# Patient Record
Sex: Female | Born: 1990 | Race: White | Hispanic: No | Marital: Married | Smoking: Never smoker
Health system: Southern US, Community
[De-identification: ages and names within clinical notes are randomized; demographics above are authoritative.]

## PROBLEM LIST (undated history)

## (undated) DIAGNOSIS — J302 Other seasonal allergic rhinitis: Secondary | ICD-10-CM

## (undated) DIAGNOSIS — J45909 Unspecified asthma, uncomplicated: Secondary | ICD-10-CM

---
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
## 2012-05-24 ENCOUNTER — Encounter (HOSPITAL_BASED_OUTPATIENT_CLINIC_OR_DEPARTMENT_OTHER): Payer: Self-pay | Admitting: *Deleted

## 2012-05-24 ENCOUNTER — Emergency Department (HOSPITAL_BASED_OUTPATIENT_CLINIC_OR_DEPARTMENT_OTHER)
Admission: EM | Admit: 2012-05-24 | Discharge: 2012-05-24 | Disposition: A | Discharge status: Home Health | Payer: Self-pay | Attending: Emergency Medicine | Admitting: Emergency Medicine

## 2012-05-24 DIAGNOSIS — N390 Urinary tract infection, site not specified: Secondary | ICD-10-CM

## 2012-05-24 DIAGNOSIS — J45909 Unspecified asthma, uncomplicated: Secondary | ICD-10-CM | POA: Insufficient documentation

## 2012-05-24 HISTORY — DX: Other seasonal allergic rhinitis: J30.2

## 2012-05-24 HISTORY — DX: Unspecified asthma, uncomplicated: J45.909

## 2012-05-24 LAB — URINALYSIS, ROUTINE W REFLEX MICROSCOPIC
Ketones, ur: 15 mg/dL — AB
Nitrite: POSITIVE — AB
Protein, ur: 30 mg/dL — AB
Specific Gravity, Urine: 1.034 — ABNORMAL HIGH (ref 1.005–1.030)
Urobilinogen, UA: 4 mg/dL — ABNORMAL HIGH (ref 0.0–1.0)
pH: 5 (ref 5.0–8.0)

## 2012-05-24 LAB — URINE MICROSCOPIC-ADD ON

## 2012-05-24 LAB — PREGNANCY, URINE: Preg Test, Ur: NEGATIVE

## 2012-05-24 MED ORDER — IBUPROFEN 800 MG PO TABS: 800.0000 mg | ORAL_TABLET | Freq: Three times a day (TID) | ORAL | Status: AC | PRN

## 2012-05-24 MED ORDER — CIPROFLOXACIN HCL 500 MG PO TABS: 500.0000 mg | ORAL_TABLET | Freq: Two times a day (BID) | ORAL | Status: AC

## 2012-05-24 NOTE — ED Provider Notes (Signed)
History     CSN: [id number]  Arrival date & time 05/24/12  2023   First MD Initiated Contact with Patient 05/24/12 2131     10:22 PM HPI Patient reports for 4 days has had urinary tract infection. States 2 days ago she was diagnosed and prescribed Bactrim. Reports symptoms have not improved despite 2 days using antibiotics and urinary relief medication. Denies abdominal pain, vaginal discharge, fever, nausea, vomiting, back pain. Reports severe dysuria, increased urinary frequency and urgency. Patient is a 21 y.o. female presenting with urinary tract infection. The history is provided by the patient.  Urinary Tract Infection This is a new problem. Episode onset: 4 days. The problem occurs constantly. The problem has been unchanged. Associated symptoms include urinary symptoms. Pertinent negatives include no abdominal pain, chest pain, chills, fever, nausea, numbness, vomiting or weakness. Exacerbated by: urinating. Treatments tried: bactrim and AZO urinary relief. The treatment provided no relief.    Past Medical History  Diagnosis Date  . Asthma   . Seasonal allergies     History reviewed. No pertinent past surgical history.  History reviewed. No pertinent family history.  History  Substance Use Topics  . Smoking status: Never Smoker   . Smokeless tobacco: Not on file  . Alcohol Use: No    OB History    Grav Para Term Preterm Abortions TAB SAB Ect Mult Living                  Review of Systems  Constitutional: Negative for fever and chills.  Respiratory: Negative for shortness of breath.   Cardiovascular: Negative for chest pain.  Gastrointestinal: Negative for nausea, vomiting, abdominal pain, diarrhea and constipation.  Genitourinary: Positive for dysuria, urgency and frequency. Negative for hematuria, flank pain, vaginal discharge and vaginal pain.  Musculoskeletal: Negative for back pain.  Neurological: Negative for weakness and numbness.  All other systems reviewed  and are negative.    Allergies  Cashew nut oil; Amoxicillin; and Penicillins  Home Medications   Current Outpatient Rx  Name Route Sig Dispense Refill  . ALBUTEROL SULFATE HFA 108 (90 BASE) MCG/ACT IN AERS Inhalation Inhale 2 puffs into the lungs every 6 (six) hours as needed. For sports induced asthma    . IBUPROFEN 200 MG PO TABS Oral Take 400 mg by mouth every 6 (six) hours as needed. For pain    . PHENAZOPYRIDINE HCL 95 MG PO TABS Oral Take 95 mg by mouth 3 (three) times daily as needed. For painful urination    . SULFAMETHOXAZOLE-TMP DS 800-160 MG PO TABS Oral Take 1 tablet by mouth 2 (two) times daily. For 5 days starting 05/20/2012      BP 107/75  Pulse 84  Temp 97.7 F (36.5 C) (Oral)  Resp 20  Ht 5\' 4"  (1.626 m)  Wt 153 lb (69.4 kg)  BMI 26.26 kg/m2  SpO2 99%  LMP 05/04/2012  Physical Exam  Vitals reviewed. Constitutional: She is oriented to person, place, and time. Vital signs are normal. She appears well-developed and well-nourished.  HENT:  Head: Normocephalic and atraumatic.  Eyes: Conjunctivae are normal. Pupils are equal, round, and reactive to light.  Neck: Normal range of motion. Neck supple.  Cardiovascular: Normal rate, regular rhythm and normal heart sounds.  Exam reveals no friction rub.   No murmur heard. Pulmonary/Chest: Effort normal and breath sounds normal. She has no wheezes. She has no rhonchi. She has no rales. She exhibits no tenderness.  Abdominal: Normal appearance and bowel sounds  are normal. She exhibits no distension and no mass. There is tenderness (mild) in the suprapubic area. There is no rigidity, no rebound, no guarding, no CVA tenderness, no tenderness at McBurney's point and negative Murphy's sign.  Musculoskeletal: Normal range of motion.  Neurological: She is alert and oriented to person, place, and time.  Skin: Skin is warm and dry. No rash noted. No erythema. No pallor.    ED Course  Procedures  Results for orders placed  during the hospital encounter of 05/24/12  URINALYSIS, ROUTINE W REFLEX MICROSCOPIC      Component Value Range   Color, Urine RED (*) YELLOW   APPearance CLOUDY (*) CLEAR   Specific Gravity, Urine 1.034 (*) 1.005 - 1.030   pH 5.0  5.0 - 8.0   Glucose, UA NEGATIVE  NEGATIVE mg/dL   Hgb urine dipstick NEGATIVE  NEGATIVE   Bilirubin Urine MODERATE (*) NEGATIVE   Ketones, ur 15 (*) NEGATIVE mg/dL   Protein, ur 30 (*) NEGATIVE mg/dL   Urobilinogen, UA 4.0 (*) 0.0 - 1.0 mg/dL   Nitrite POSITIVE (*) NEGATIVE   Leukocytes, UA MODERATE (*) NEGATIVE  PREGNANCY, URINE      Component Value Range   Preg Test, Ur NEGATIVE  NEGATIVE  URINE MICROSCOPIC-ADD ON      Component Value Range   Squamous Epithelial / LPF FEW (*) RARE   WBC, UA 3-6  <3 WBC/hpf   RBC / HPF 3-6  <3 RBC/hpf   Bacteria, UA FEW (*) RARE     MDM   Treat patient with ciprofloxacin for UTI. Have also ordered a urine culture. Patient voices understanding and is ready for discharge  Thomasene Lot, Cordelia Poche 05/24/12 2227

## 2012-05-24 NOTE — Discharge Instructions (Signed)

## 2012-05-24 NOTE — ED Notes (Signed)
Pt states she was having urinary s/s and saw the Dr. Around the middle of the week. Dx'd with UTI. Given Bactrim. Taking Azo as well, but now having increased pain and itching.

## 2012-05-26 LAB — URINE CULTURE
Colony Count: NO GROWTH
Culture: NO GROWTH

## 2012-05-26 NOTE — ED Provider Notes (Signed)
Medical screening examination/treatment/procedure(s) were performed by non-physician practitioner and as supervising physician I was immediately available for consultation/collaboration.    Cherine Drumgoole L Jonothan Heberle, MD 05/26/12 0708 

## 2012-08-25 ENCOUNTER — Encounter (HOSPITAL_BASED_OUTPATIENT_CLINIC_OR_DEPARTMENT_OTHER): Payer: Self-pay | Admitting: *Deleted

## 2012-08-25 ENCOUNTER — Emergency Department (HOSPITAL_BASED_OUTPATIENT_CLINIC_OR_DEPARTMENT_OTHER)
Admission: EM | Admit: 2012-08-25 | Discharge: 2012-08-25 | Disposition: A | Discharge status: Home / Self Care | Payer: Self-pay | Attending: Emergency Medicine | Admitting: Emergency Medicine

## 2012-08-25 DIAGNOSIS — N390 Urinary tract infection, site not specified: Secondary | ICD-10-CM | POA: Insufficient documentation

## 2012-08-25 DIAGNOSIS — Z880 Allergy status to penicillin: Secondary | ICD-10-CM | POA: Insufficient documentation

## 2012-08-25 DIAGNOSIS — Z91018 Allergy to other foods: Secondary | ICD-10-CM | POA: Insufficient documentation

## 2012-08-25 LAB — URINALYSIS, ROUTINE W REFLEX MICROSCOPIC
Ketones, ur: 15 mg/dL — AB
Nitrite: POSITIVE — AB
Protein, ur: NEGATIVE mg/dL
Urobilinogen, UA: 4 mg/dL — ABNORMAL HIGH (ref 0.0–1.0)
pH: 6 (ref 5.0–8.0)

## 2012-08-25 LAB — URINE MICROSCOPIC-ADD ON

## 2012-08-25 MED ORDER — CIPROFLOXACIN HCL 500 MG PO TABS: 500.0000 mg | ORAL_TABLET | Freq: Two times a day (BID) | ORAL | Status: DC

## 2012-08-25 MED ORDER — PHENAZOPYRIDINE HCL 200 MG PO TABS: 200.0000 mg | ORAL_TABLET | Freq: Three times a day (TID) | ORAL | Status: DC

## 2012-08-25 NOTE — ED Notes (Signed)
Possible UTI. Vaginal stinging. Some relief with AZO. Hx of UTI.

## 2012-08-25 NOTE — ED Provider Notes (Signed)
History     CSN: [id number]  Arrival date & time 08/25/12  1322   First MD Initiated Contact with Patient 08/25/12 1401      Chief Complaint  Patient presents with  . Urinary Tract Infection    (Consider location/radiation/quality/duration/timing/severity/associated sxs/prior treatment) HPI Comments: Patient with two day history of burning with urination, frequency.  No fever, chills, or vomiting.  Had uti in June of this past year and treated with cipro successfully.  The LMP was two weeks ago and normal.  She is sexually active with one partner using protection but denies discharge or bleeding.  Patient is a 21 y.o. female presenting with urinary tract infection. The history is provided by the patient.  Urinary Tract Infection This is a recurrent problem. The current episode started 2 days ago. The problem occurs constantly. The problem has been gradually worsening. Pertinent negatives include no chest pain and no abdominal pain. Exacerbated by: urinating. Nothing relieves the symptoms. She has tried nothing for the symptoms. The treatment provided mild relief.    Past Medical History  Diagnosis Date  . Asthma   . Seasonal allergies     History reviewed. No pertinent past surgical history.  No family history on file.  History  Substance Use Topics  . Smoking status: Never Smoker   . Smokeless tobacco: Not on file  . Alcohol Use: No    OB History    Grav Para Term Preterm Abortions TAB SAB Ect Mult Living                  Review of Systems  Cardiovascular: Negative for chest pain.  Gastrointestinal: Negative for abdominal pain.  All other systems reviewed and are negative.    Allergies  Cashew nut oil; Amoxicillin; and Penicillins  Home Medications   Current Outpatient Rx  Name Route Sig Dispense Refill  . ALBUTEROL SULFATE HFA 108 (90 BASE) MCG/ACT IN AERS Inhalation Inhale 2 puffs into the lungs every 6 (six) hours as needed. For sports induced asthma      . IBUPROFEN 200 MG PO TABS Oral Take 400 mg by mouth every 6 (six) hours as needed. For pain    . PHENAZOPYRIDINE HCL 95 MG PO TABS Oral Take 95 mg by mouth 3 (three) times daily as needed. For painful urination    . SULFAMETHOXAZOLE-TMP DS 800-160 MG PO TABS Oral Take 1 tablet by mouth 2 (two) times daily. For 5 days starting 05/20/2012      BP 117/71  Pulse 82  Temp 97.8 F (36.6 C) (Oral)  Resp 18  SpO2 100%  LMP 08/13/2012  Physical Exam  Nursing note and vitals reviewed. Constitutional: She is oriented to person, place, and time. She appears well-developed and well-nourished. No distress.  HENT:  Head: Normocephalic and atraumatic.  Mouth/Throat: Oropharynx is clear and moist.  Neck: Normal range of motion. Neck supple.  Abdominal: Soft. Bowel sounds are normal. She exhibits no distension. There is no tenderness.       There is no cva tenderness.  Musculoskeletal: Normal range of motion.  Neurological: She is alert and oriented to person, place, and time.  Skin: Skin is warm and dry. She is not diaphoretic.    ED Course  Procedures (including critical care time)   Labs Reviewed  PREGNANCY, URINE  URINALYSIS, ROUTINE W REFLEX MICROSCOPIC   No results found.   No diagnosis found.    MDM  The patient presents with urinary frequency, urgency.  The ua  reveals a uti.  Will treat with cipro, pyridium.  To return prn for any problems.        Geoffery Lyons, MD 08/25/12 1416

## 2012-08-25 NOTE — ED Notes (Signed)
Pt. Reports no discharge and has history of UTI burning with urination.

## 2016-10-23 ENCOUNTER — Emergency Department (HOSPITAL_BASED_OUTPATIENT_CLINIC_OR_DEPARTMENT_OTHER): Payer: Self-pay

## 2016-10-23 ENCOUNTER — Encounter (HOSPITAL_BASED_OUTPATIENT_CLINIC_OR_DEPARTMENT_OTHER): Payer: Self-pay | Admitting: *Deleted

## 2016-10-23 ENCOUNTER — Emergency Department (HOSPITAL_BASED_OUTPATIENT_CLINIC_OR_DEPARTMENT_OTHER)
Admission: EM | Admit: 2016-10-23 | Discharge: 2016-10-23 | Disposition: A | Discharge status: Home / Self Care | Payer: Self-pay | Attending: Physician Assistant | Admitting: Physician Assistant

## 2016-10-23 DIAGNOSIS — R109 Unspecified abdominal pain: Secondary | ICD-10-CM

## 2016-10-23 DIAGNOSIS — J45909 Unspecified asthma, uncomplicated: Secondary | ICD-10-CM | POA: Insufficient documentation

## 2016-10-23 DIAGNOSIS — R112 Nausea with vomiting, unspecified: Secondary | ICD-10-CM | POA: Insufficient documentation

## 2016-10-23 DIAGNOSIS — R1033 Periumbilical pain: Secondary | ICD-10-CM | POA: Insufficient documentation

## 2016-10-23 LAB — CBC WITH DIFFERENTIAL/PLATELET
Basophils Absolute: 0 10*3/uL (ref 0.0–0.1)
Basophils Relative: 0 %
Eosinophils Absolute: 0.6 10*3/uL (ref 0.0–0.7)
Eosinophils Relative: 8 %
HCT: 43 % (ref 36.0–46.0)
HEMOGLOBIN: 14.6 g/dL (ref 12.0–15.0)
LYMPHS ABS: 1.8 10*3/uL (ref 0.7–4.0)
LYMPHS PCT: 23 %
MCH: 29.1 pg (ref 26.0–34.0)
MCHC: 34 g/dL (ref 30.0–36.0)
MCV: 85.7 fL (ref 78.0–100.0)
MONOS PCT: 6 %
Monocytes Absolute: 0.5 10*3/uL (ref 0.1–1.0)
NEUTROS PCT: 63 %
Neutro Abs: 4.9 10*3/uL (ref 1.7–7.7)
Platelets: 321 10*3/uL (ref 150–400)
RBC: 5.02 MIL/uL (ref 3.87–5.11)
RDW: 13 % (ref 11.5–15.5)
WBC: 7.7 10*3/uL (ref 4.0–10.5)

## 2016-10-23 LAB — URINALYSIS, ROUTINE W REFLEX MICROSCOPIC
Bilirubin Urine: NEGATIVE
Glucose, UA: NEGATIVE mg/dL
HGB URINE DIPSTICK: NEGATIVE
Ketones, ur: NEGATIVE mg/dL
Leukocytes, UA: NEGATIVE
NITRITE: NEGATIVE
PH: 5.5 (ref 5.0–8.0)
Protein, ur: NEGATIVE mg/dL
SPECIFIC GRAVITY, URINE: 1.017 (ref 1.005–1.030)

## 2016-10-23 LAB — COMPREHENSIVE METABOLIC PANEL
ALK PHOS: 47 U/L (ref 38–126)
ALT: 22 U/L (ref 14–54)
ANION GAP: 7 (ref 5–15)
AST: 27 U/L (ref 15–41)
Albumin: 4 g/dL (ref 3.5–5.0)
BILIRUBIN TOTAL: 0.4 mg/dL (ref 0.3–1.2)
BUN: 11 mg/dL (ref 6–20)
CALCIUM: 9.4 mg/dL (ref 8.9–10.3)
CO2: 27 mmol/L (ref 22–32)
CREATININE: 0.91 mg/dL (ref 0.44–1.00)
Chloride: 100 mmol/L — ABNORMAL LOW (ref 101–111)
Glucose, Bld: 93 mg/dL (ref 65–99)
Potassium: 3.6 mmol/L (ref 3.5–5.1)
Sodium: 134 mmol/L — ABNORMAL LOW (ref 135–145)
TOTAL PROTEIN: 7.6 g/dL (ref 6.5–8.1)

## 2016-10-23 LAB — LIPASE, BLOOD: LIPASE: 26 U/L (ref 11–51)

## 2016-10-23 LAB — PREGNANCY, URINE: Preg Test, Ur: NEGATIVE

## 2016-10-23 MED ORDER — OXYCODONE-ACETAMINOPHEN 5-325 MG PO TABS: 1.0000 | ORAL_TABLET | ORAL | 0 refills | Status: AC | PRN

## 2016-10-23 MED ORDER — ONDANSETRON 4 MG PO TBDP: 4.0000 mg | ORAL_TABLET | Freq: Three times a day (TID) | ORAL | 0 refills | Status: AC | PRN

## 2016-10-23 MED ORDER — MORPHINE SULFATE (PF) 4 MG/ML IV SOLN
4.0000 mg | Freq: Once | INTRAVENOUS | Status: AC
  Administered 2016-10-23: 4 mg via INTRAVENOUS
  Filled 2016-10-23: qty 1

## 2016-10-23 MED ORDER — ONDANSETRON HCL 4 MG/2ML IJ SOLN
4.0000 mg | Freq: Once | INTRAMUSCULAR | Status: AC
  Administered 2016-10-23: 4 mg via INTRAVENOUS
  Filled 2016-10-23: qty 2

## 2016-10-23 MED ORDER — IOPAMIDOL (ISOVUE-300) INJECTION 61%
100.0000 mL | Freq: Once | INTRAVENOUS | Status: AC | PRN
  Administered 2016-10-23: 100 mL via INTRAVENOUS

## 2016-10-23 NOTE — ED Triage Notes (Signed)
Pt c/o diffuse abd pain x 3 days , sent here from PMD office for eval

## 2016-10-23 NOTE — ED Provider Notes (Signed)
[facility]   CSN: [id number] Arrival date & time: 10/23/16  1627     History   Chief Complaint Chief Complaint  Patient presents with  . Abdominal Pain    HPI Danielle Beasley is a 25 y.o. female.  The history is provided by the patient and medical records.  Abdominal Pain   Associated symptoms include nausea and vomiting.     25 y.o. F with hx of asthma and seasonal allergies, presenting to the ED for abdominal pain.  States this began Sunday Night and has remained persistent since. States pain is localized around her umbilicus, but sometimes "fans out" across her lower abdomen. She reports associated nausea and vomiting which is usually worse in the morning. She also reports a few episodes of loose stools but no frank diarrhea. No melena or hematochezia. No fever or chills. States she did not eat at a [employer], however her husband ate the same food as her and is not having any symptoms. No history of GI issues in the past. No prior abdominal surgeries. States she was seen by her primary care doctor and sent here for further evaluation.  Past Medical History:  Diagnosis Date  . Asthma   . Seasonal allergies     There are no active problems to display for this patient.   History reviewed. No pertinent surgical history.  OB History    No data available       Home Medications    Prior to Admission medications   Medication Sig Start Date End Date Taking? Authorizing Provider  albuterol (PROVENTIL HFA;VENTOLIN HFA) 108 (90 BASE) MCG/ACT inhaler Inhale 2 puffs into the lungs every 6 (six) hours as needed. For sports induced asthma    Historical Provider, MD  ibuprofen (ADVIL,MOTRIN) 200 MG tablet Take 400 mg by mouth every 6 (six) hours as needed. For pain    Historical Provider, MD    Family History No family history on file.  Social History Social History  Substance Use Topics  . Smoking status: Never Smoker  . Smokeless  tobacco: Not on file  . Alcohol use No     Allergies   Cashew nut oil; Amoxicillin; and Penicillins   Review of Systems Review of Systems  Gastrointestinal: Positive for abdominal pain, nausea and vomiting.  All other systems reviewed and are negative.    Physical Exam Updated Vital Signs Ht 5\' 4"  (1.626 m)   Wt 93.2 kg   LMP 10/18/2016   BMI 35.26 kg/m   Physical Exam  Constitutional: She is oriented to person, place, and time. She appears well-developed and well-nourished.  HENT:  Head: Normocephalic and atraumatic.  Mouth/Throat: Oropharynx is clear and moist.  Eyes: Conjunctivae and EOM are normal. Pupils are equal, round, and reactive to light.  Neck: Normal range of motion.  Cardiovascular: Normal rate, regular rhythm and normal heart sounds.   Pulmonary/Chest: Effort normal and breath sounds normal. No respiratory distress. She has no wheezes.  Abdominal: Soft. Bowel sounds are normal. There is tenderness in the periumbilical area.  Musculoskeletal: Normal range of motion.  Neurological: She is alert and oriented to person, place, and time.  Skin: Skin is warm and dry.  Psychiatric: She has a normal mood and affect.  Nursing note and vitals reviewed.    ED Treatments / Results  Labs (all labs ordered are listed, but only abnormal results are displayed) Labs Reviewed  COMPREHENSIVE METABOLIC PANEL - Abnormal; Notable for the following:  Result Value   Sodium 134 (*)    Chloride 100 (*)    All other components within normal limits  PREGNANCY, URINE  URINALYSIS, ROUTINE W REFLEX MICROSCOPIC (NOT AT [facility])  CBC WITH DIFFERENTIAL/PLATELET  LIPASE, BLOOD    EKG  EKG Interpretation None       Radiology Ct Abdomen Pelvis W Contrast  Result Date: 10/23/2016 CLINICAL DATA:  Acute onset of mid abdominal pain, nausea, vomiting and diarrhea. Initial encounter. EXAM: CT ABDOMEN AND PELVIS WITH CONTRAST TECHNIQUE: Multidetector CT imaging of the abdomen  and pelvis was performed using the standard protocol following bolus administration of intravenous contrast. CONTRAST:  100mL ISOVUE-300 IOPAMIDOL (ISOVUE-300) INJECTION 61% COMPARISON:  None. FINDINGS: Lower chest: Minimal bibasilar atelectasis is noted. The visualized portions of the mediastinum are unremarkable. Hepatobiliary: The liver is unremarkable in appearance. The gallbladder is unremarkable in appearance. The common bile duct remains normal in caliber. Pancreas: The pancreas is within normal limits. Spleen: The spleen is unremarkable in appearance. Adrenals/Urinary Tract: The adrenal glands are unremarkable in appearance. The kidneys are within normal limits. There is no evidence of hydronephrosis. No renal or ureteral stones are identified. No perinephric stranding is seen. Stomach/Bowel: The stomach is unremarkable in appearance. The small bowel is within normal limits. The appendix is normal in caliber, without evidence of appendicitis. The colon is largely decompressed and unremarkable in appearance. Vascular/Lymphatic: The abdominal aorta is unremarkable in appearance. The inferior vena cava is grossly unremarkable. No retroperitoneal lymphadenopathy is seen. No pelvic sidewall lymphadenopathy is identified. Reproductive: The bladder is mildly distended and within normal limits. The uterus is grossly unremarkable in appearance. The ovaries are relatively symmetric. No suspicious adnexal masses are seen. Other: Trace fluid within the pelvis is likely physiologic in nature. Musculoskeletal: No acute osseous abnormalities are identified. The visualized musculature is unremarkable in appearance. IMPRESSION: Unremarkable contrast-enhanced CT of the abdomen and pelvis. Electronically Signed   By: Roanna RaiderJeffery  Chang M.D.   On: 10/23/2016 19:07    Procedures Procedures (including critical care time)  Medications Ordered in ED Medications - No data to display   Initial Impression / Assessment and Plan /  ED Course  I have reviewed the triage vital signs and the nursing notes.  Pertinent labs & imaging results that were available during my care of the patient were reviewed by me and considered in my medical decision making (see chart for details).  Clinical Course    25 year old female sent here from PCP office for abdominal pain. This has been ongoing for several days.  Here she is afebrile and nontoxic. She does have some tenderness of her periumbilical region on exam. No rebound or guarding. Labwork is reassuring. UA without any signs of infection. CT scan obtained, no acute findings. Patient has not had any emesis here. Symptoms are controlled well at this time. Feel she is stable for discharge home with supportive care. Recommended that she follow-up with her primary care doctor.  Discussed plan with patient, she acknowledged understanding and agreed with plan of care.  Return precautions given for new or worsening symptoms.  Final Clinical Impressions(s) / ED Diagnoses   Final diagnoses:  Abdominal pain, unspecified abdominal location  Non-intractable vomiting with nausea, unspecified vomiting type    New Prescriptions Discharge Medication List as of 10/23/2016  7:19 PM    START taking these medications   Details  ondansetron (ZOFRAN ODT) 4 MG disintegrating tablet Take 1 tablet (4 mg total) by mouth every 8 (eight) hours as needed  for nausea., Starting Wed 10/23/2016, Print    oxyCODONE-acetaminophen (PERCOCET/ROXICET) 5-325 MG tablet Take 1 tablet by mouth every 4 (four) hours as needed., Starting Wed 10/23/2016, Print         Garlon HatchetLisa M Hudsyn Champine, PA-C 10/23/16 2046    Courteney Lyn Mackuen, MD 10/25/16 1356

## 2016-10-23 NOTE — ED Notes (Signed)
Patient transported to CT 

## 2016-10-23 NOTE — Discharge Instructions (Signed)
Take the prescribed medication as directed. Follow-up with your primary care doctor. Return to the ED for new or worsening symptoms-- high fever, increased pain, uncontrolled vomiting, etc.

## 2018-08-06 ENCOUNTER — Emergency Department (HOSPITAL_COMMUNITY): Payer: Self-pay

## 2018-08-06 ENCOUNTER — Encounter (HOSPITAL_COMMUNITY): Payer: Self-pay | Admitting: Emergency Medicine

## 2018-08-06 ENCOUNTER — Emergency Department (HOSPITAL_COMMUNITY)
Admission: EM | Admit: 2018-08-06 | Discharge: 2018-08-07 | Disposition: A | Discharge status: Home / Self Care | Payer: Self-pay | Attending: Emergency Medicine | Admitting: Emergency Medicine

## 2018-08-06 DIAGNOSIS — M25561 Pain in right knee: Secondary | ICD-10-CM | POA: Insufficient documentation

## 2018-08-06 DIAGNOSIS — Z79899 Other long term (current) drug therapy: Secondary | ICD-10-CM | POA: Insufficient documentation

## 2018-08-06 DIAGNOSIS — J45909 Unspecified asthma, uncomplicated: Secondary | ICD-10-CM | POA: Insufficient documentation

## 2018-08-06 MED ORDER — IBUPROFEN 400 MG PO TABS
600.0000 mg | ORAL_TABLET | Freq: Once | ORAL | Status: AC
  Administered 2018-08-07: 600 mg via ORAL
  Filled 2018-08-06: qty 1

## 2018-08-06 NOTE — ED Triage Notes (Signed)
Pt presents to ED for assessment of right knee pain after lifting a keg at work.  States she felt a pop, and a second pop.  HX of dislocation approx 5 years ago.  Knee is swelling, increasing in pain, and more difficult to ambulate on.

## 2018-08-06 NOTE — Discharge Instructions (Addendum)
Your x-ray did not show any signs of dislocation or fracture.  This is likely your kneecap.  Would recommend ibuprofen or Motrin at home to help with the pain. Please rest, ice, compress and elevated the affected body part to help with swelling and pain.  Perform the exercises as discussed.  Use the knee immobilizer and crutches for comfort.  Follow-up with orthopedic doctor if symptoms persist to return the ED with any worsening symptoms.

## 2018-08-06 NOTE — ED Provider Notes (Signed)
[facility] EMERGENCY DEPARTMENT Provider Note   CSN: [id number] Arrival date & time: 08/06/18  2116     History   Chief Complaint Chief Complaint  Patient presents with  . Knee Pain    HPI MAVA SUARES is a 27 y.o. female.  HPI 27 year old Caucasian female with no pertinent past medical history presents to the ED for evaluation of right knee pain.  States this was secondary to mechanical injury.  She was lifting a beer cake at work when she felt 2 pops to her right knee.  She reports some swelling to the right knee.  She is able to ambulate but does cause her some pain.  Pain is worse with ambulation, range of motion and palpation.  She reports history of dislocation and states this feels very similar.  Denies any paresthesias or weakness.  Did take 3 ibuprofen at 430 this afternoon when the accident occurred.  She has not taken anything else for the pain this evening. Past Medical History:  Diagnosis Date  . Asthma   . Seasonal allergies     There are no active problems to display for this patient.   No past surgical history on file.   OB History   None      Home Medications    Prior to Admission medications   Medication Sig Start Date End Date Taking? Authorizing Provider  albuterol (PROVENTIL HFA;VENTOLIN HFA) 108 (90 BASE) MCG/ACT inhaler Inhale 2 puffs into the lungs every 6 (six) hours as needed. For sports induced asthma    [provider]  ibuprofen (ADVIL,MOTRIN) 200 MG tablet Take 400 mg by mouth every 6 (six) hours as needed. For pain    [provider]  ondansetron (ZOFRAN ODT) 4 MG disintegrating tablet Take 1 tablet (4 mg total) by mouth every 8 (eight) hours as needed for nausea. 10/23/16   Garlon Hatchet, PA-C  oxyCODONE-acetaminophen (PERCOCET/ROXICET) 5-325 MG tablet Take 1 tablet by mouth every 4 (four) hours as needed. 10/23/16   Garlon Hatchet, PA-C    Family History No family history on file.  Social  History Social History   Tobacco Use  . Smoking status: Never Smoker  . Smokeless tobacco: Never Used  Substance Use Topics  . Alcohol use: No  . Drug use: No     Allergies   Cashew nut oil; Amoxicillin; and Penicillins   Review of Systems Review of Systems  Constitutional: Negative for fever.  Musculoskeletal: Positive for arthralgias, joint swelling and myalgias.  Skin: Negative for color change.  Neurological: Negative for weakness and numbness.     Physical Exam Updated Vital Signs BP 125/81 (BP Location: Right Arm)   Pulse 85   Temp 98.3 F (36.8 C) (Oral)   Resp 18   Ht 5\' 4"  (1.626 m)   Wt 100.7 kg   LMP 07/30/2018   SpO2 97%   BMI 38.11 kg/m   Physical Exam  Constitutional: She appears well-developed and well-nourished. No distress.  HENT:  Head: Normocephalic and atraumatic.  Eyes: Right eye exhibits no discharge. Left eye exhibits no discharge. No scleral icterus.  Neck: Normal range of motion.  Pulmonary/Chest: No respiratory distress.  Musculoskeletal:       Right knee: She exhibits decreased range of motion, swelling, abnormal patellar mobility and bony tenderness. She exhibits no effusion, no ecchymosis, no deformity, no laceration, no erythema, normal alignment, no LCL laxity, normal meniscus and no MCL laxity. Tenderness found. Patellar tendon tenderness noted.  No effusion noted.  Skin compartments are soft.  No calf tenderness.  DP pulses are 2+ bilaterally.  Brisk cap refill.  Sensation intact.  Limited range of motion of the right knee secondary to pain but full range of motion of the right hip and right ankle.  Neurological: She is alert.  Skin: No pallor.  Psychiatric: Her behavior is normal. Judgment and thought content normal.  Nursing note and vitals reviewed.    ED Treatments / Results  Labs (all labs ordered are listed, but only abnormal results are displayed) Labs Reviewed - No data to display  EKG None  Radiology Dg Knee  Complete 4 Views Right  Result Date: 08/06/2018 CLINICAL DATA:  Knee pain.  History of dislocation. EXAM: RIGHT KNEE - COMPLETE 4+ VIEW COMPARISON:  None. FINDINGS: No evidence of fracture, dislocation, or joint effusion. No evidence of arthropathy or other focal bone abnormality. Soft tissues are unremarkable. IMPRESSION: Negative. Electronically Signed   By: Ted Mcalpineobrinka  Dimitrova M.D.   On: 08/06/2018 21:59    Procedures Procedures (including critical care time)  Medications Ordered in ED Medications  ibuprofen (ADVIL,MOTRIN) tablet 600 mg (has no administration in time range)     Initial Impression / Assessment and Plan / ED Course  I have reviewed the triage vital signs and the nursing notes.  Pertinent labs & imaging results that were available during my care of the patient were reviewed by me and considered in my medical decision making (see chart for details).     Patient X-Ray negative for obvious fracture or dislocation.  She is neurovascularly intact.  Presentation seems consistent patellar subluxation.  Presentation not consistent with DVT.  Pain managed in ED. Pt advised to follow up with orthopedics if symptoms persist for possibility of missed fracture diagnosis. Patient given brace while in ED, conservative therapy recommended and discussed. Patient will be dc home & is agreeable with above plan.   Final Clinical Impressions(s) / ED Diagnoses   Final diagnoses:  Acute pain of right knee    ED Discharge Orders    None       Wallace KellerLeaphart, Laural Eiland T, PA-C 08/06/18 2354    Nira Connardama, Pedro Eduardo, MD 08/07/18 [phone]

## 2018-08-07 NOTE — Progress Notes (Signed)
Orthopedic Tech Progress Note Patient Details:  Danielle Beasley 05-30-1991 [id number]  Ortho Devices Type of Ortho Device: Crutches, Knee Immobilizer Ortho Device/Splint Location: rle Ortho Device/Splint Interventions: Ordered, Application, Adjustment   Post Interventions Patient Tolerated: Well Instructions Provided: Care of device, Adjustment of device   [name], [name] 08/07/2018, 12:34 AM

## 2019-08-03 ENCOUNTER — Other Ambulatory Visit: Payer: Self-pay

## 2019-08-03 DIAGNOSIS — Z20822 Contact with and (suspected) exposure to covid-19: Secondary | ICD-10-CM

## 2019-08-04 LAB — NOVEL CORONAVIRUS, NAA: SARS-CoV-2, NAA: NOT DETECTED

## 2019-10-19 ENCOUNTER — Emergency Department (HOSPITAL_COMMUNITY)
Admission: EM | Admit: 2019-10-19 | Discharge: 2019-10-20 | Discharge status: Against Medical Advice | Payer: Self-pay | Attending: Emergency Medicine | Admitting: Emergency Medicine

## 2019-10-19 ENCOUNTER — Emergency Department (HOSPITAL_COMMUNITY): Payer: Self-pay

## 2019-10-19 ENCOUNTER — Encounter (HOSPITAL_COMMUNITY): Payer: Self-pay | Admitting: Emergency Medicine

## 2019-10-19 DIAGNOSIS — Z5321 Procedure and treatment not carried out due to patient leaving prior to being seen by health care provider: Secondary | ICD-10-CM | POA: Insufficient documentation

## 2019-10-19 LAB — CBC WITH DIFFERENTIAL/PLATELET
Abs Immature Granulocytes: 0.03 10*3/uL (ref 0.00–0.07)
Basophils Absolute: 0 10*3/uL (ref 0.0–0.1)
Basophils Relative: 0 %
Eosinophils Absolute: 0.4 10*3/uL (ref 0.0–0.5)
Eosinophils Relative: 6 %
HCT: 45.1 % (ref 36.0–46.0)
Hemoglobin: 14.7 g/dL (ref 12.0–15.0)
Immature Granulocytes: 0 %
Lymphocytes Relative: 23 %
Lymphs Abs: 1.7 10*3/uL (ref 0.7–4.0)
MCH: 28.5 pg (ref 26.0–34.0)
MCHC: 32.6 g/dL (ref 30.0–36.0)
MCV: 87.4 fL (ref 80.0–100.0)
Monocytes Absolute: 0.4 10*3/uL (ref 0.1–1.0)
Monocytes Relative: 5 %
Neutro Abs: 4.6 10*3/uL (ref 1.7–7.7)
Neutrophils Relative %: 66 %
Platelets: 348 10*3/uL (ref 150–400)
RBC: 5.16 MIL/uL — ABNORMAL HIGH (ref 3.87–5.11)
RDW: 13 % (ref 11.5–15.5)
WBC: 7.1 10*3/uL (ref 4.0–10.5)
nRBC: 0 % (ref 0.0–0.2)

## 2019-10-19 LAB — URINALYSIS, ROUTINE W REFLEX MICROSCOPIC
Bilirubin Urine: NEGATIVE
Glucose, UA: NEGATIVE mg/dL
Ketones, ur: NEGATIVE mg/dL
Nitrite: NEGATIVE
Protein, ur: NEGATIVE mg/dL
Specific Gravity, Urine: 1.014 (ref 1.005–1.030)
pH: 6 (ref 5.0–8.0)

## 2019-10-19 LAB — COMPREHENSIVE METABOLIC PANEL
ALT: 19 U/L (ref 0–44)
AST: 22 U/L (ref 15–41)
Albumin: 4.1 g/dL (ref 3.5–5.0)
Alkaline Phosphatase: 45 U/L (ref 38–126)
Anion gap: 9 (ref 5–15)
BUN: 11 mg/dL (ref 6–20)
CO2: 25 mmol/L (ref 22–32)
Calcium: 10.1 mg/dL (ref 8.9–10.3)
Chloride: 103 mmol/L (ref 98–111)
Creatinine, Ser: 0.91 mg/dL (ref 0.44–1.00)
GFR calc Af Amer: >60 mL/min (ref >60)
GFR calc non Af Amer: >60 mL/min (ref >60)
Glucose, Bld: 87 mg/dL (ref 70–99)
Potassium: 4.6 mmol/L (ref 3.5–5.1)
Sodium: 137 mmol/L (ref 135–145)
Total Bilirubin: 0.6 mg/dL (ref 0.3–1.2)
Total Protein: 7.3 g/dL (ref 6.5–8.1)

## 2019-10-19 LAB — LIPASE, BLOOD: Lipase: 36 U/L (ref 11–51)

## 2019-10-19 LAB — I-STAT BETA HCG BLOOD, ED (MC, WL, AP ONLY): I-stat hCG, quantitative: <5 m[IU]/mL (ref <5)

## 2019-10-19 NOTE — ED Notes (Signed)
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

## 2019-10-19 NOTE — ED Triage Notes (Signed)
Pt here from home with c/o nausea and  weakness along with a frontal h/a and some blurred vision , slurring of her words  At times fore about a week

## 2019-11-16 ENCOUNTER — Other Ambulatory Visit: Payer: Self-pay

## 2019-11-16 DIAGNOSIS — Z20822 Contact with and (suspected) exposure to covid-19: Secondary | ICD-10-CM

## 2019-11-17 LAB — NOVEL CORONAVIRUS, NAA: SARS-CoV-2, NAA: NOT DETECTED

## 2019-12-13 ENCOUNTER — Ambulatory Visit: Payer: Self-pay | Attending: Internal Medicine

## 2019-12-13 DIAGNOSIS — Z20822 Contact with and (suspected) exposure to covid-19: Secondary | ICD-10-CM | POA: Insufficient documentation

## 2019-12-14 LAB — NOVEL CORONAVIRUS, NAA: SARS-CoV-2, NAA: NOT DETECTED

## 2020-01-19 ENCOUNTER — Ambulatory Visit: Payer: Self-pay | Attending: Internal Medicine

## 2020-01-19 DIAGNOSIS — Z20822 Contact with and (suspected) exposure to covid-19: Secondary | ICD-10-CM

## 2020-01-20 LAB — NOVEL CORONAVIRUS, NAA: SARS-CoV-2, NAA: NOT DETECTED

## 2020-06-18 IMAGING — CT CT HEAD W/O CM
3 series · 15 of 47 positions shown, 18 images · non-contrast
Comparison: No pertinent prior studies available for comparison.

CLINICAL DATA: Headache, chronic, neuro deficit. Additional history
provided: Nausea and weakness along with frontal headache and some
blurred vision, slurring of words at times for about a week.

EXAM:
CT HEAD WITHOUT CONTRAST
TECHNIQUE: Contiguous axial images were obtained from the base of the skull
through the vertex without intravenous contrast.

[Series 2: head 5.0 h30s · axial · 0.42mm/px · z∈[-69,+56]mm · 9 of 31 slices shown, 12 images]
[im 3/31  brain]
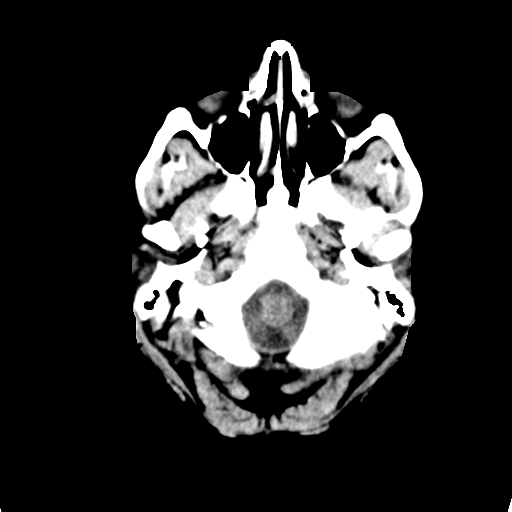
[im 3/31  bone]
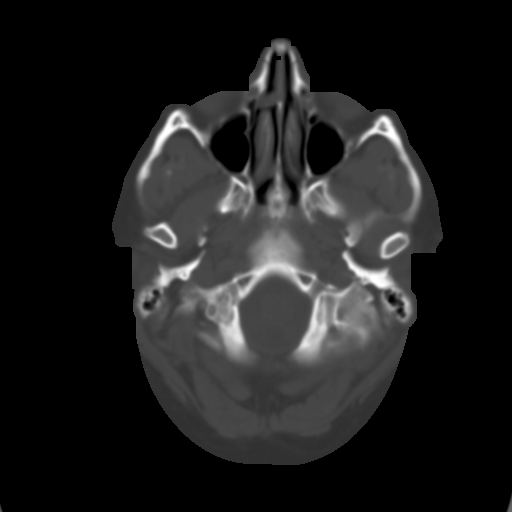
[im 6/31  brain]
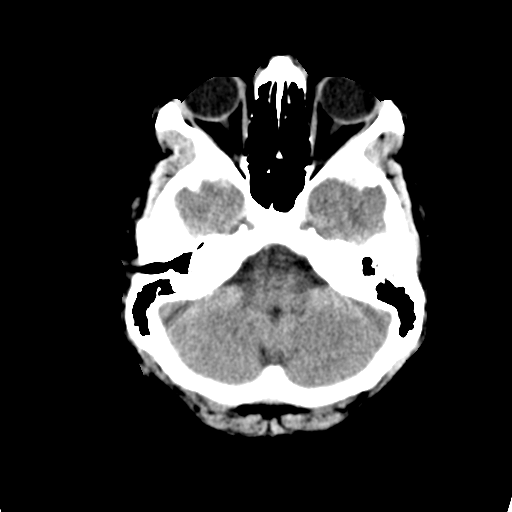
[im 9/31  brain]
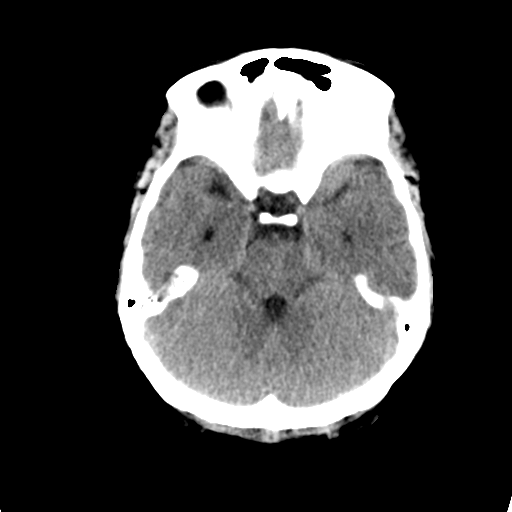
[im 12/31  brain]
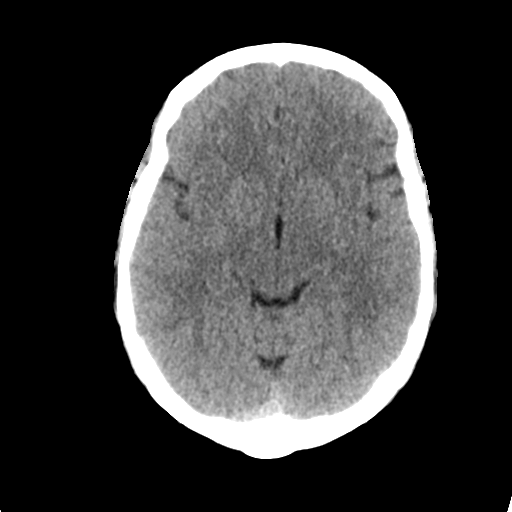
[im 16/31  brain]
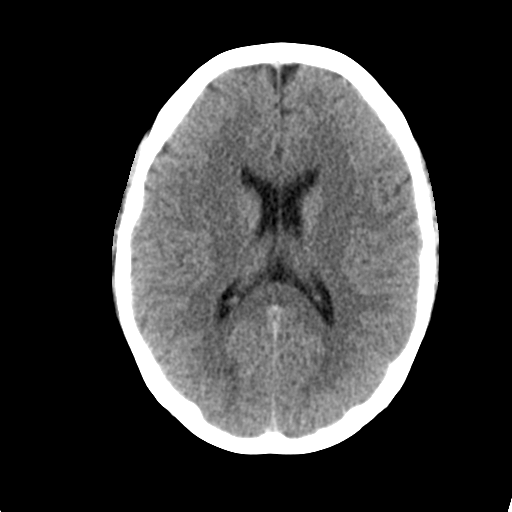
[im 16/31  bone]
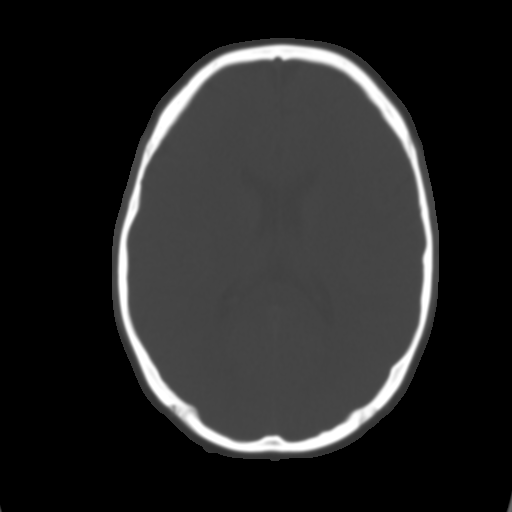
[im 19/31  brain]
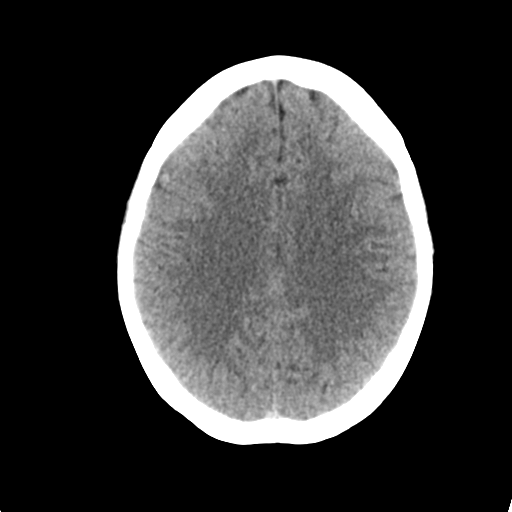
[im 22/31  brain]
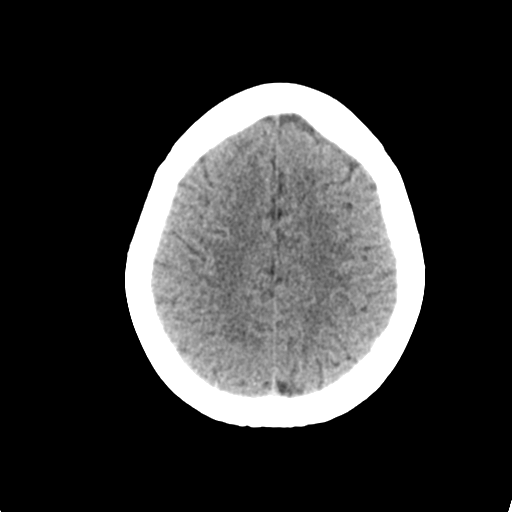
[im 25/31  brain]
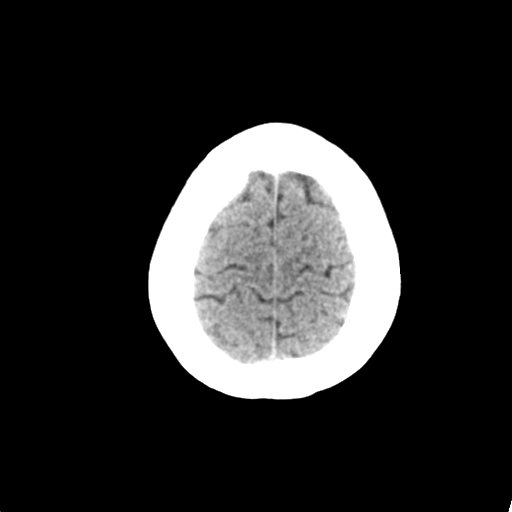
[im 28/31  brain]
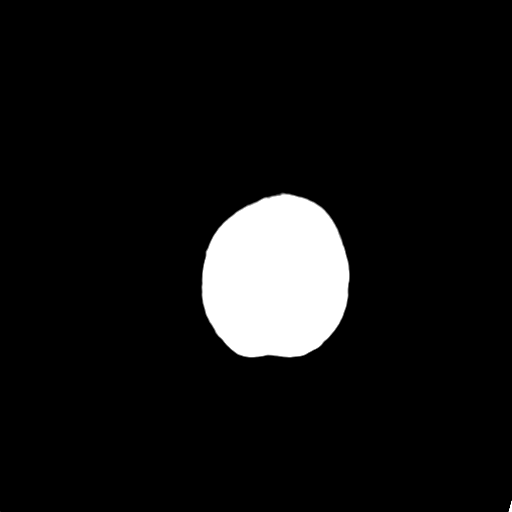
[im 28/31  bone]
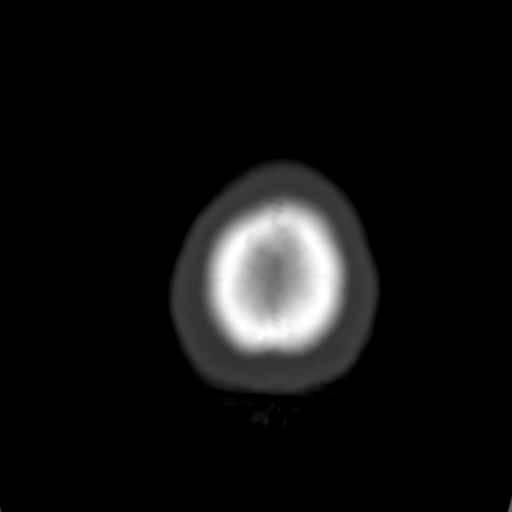

[Series 4: head 3.0 mpr cor · coronal · 0.30mm/px · 3 of 67 slices shown]
[im 23/67  brain]
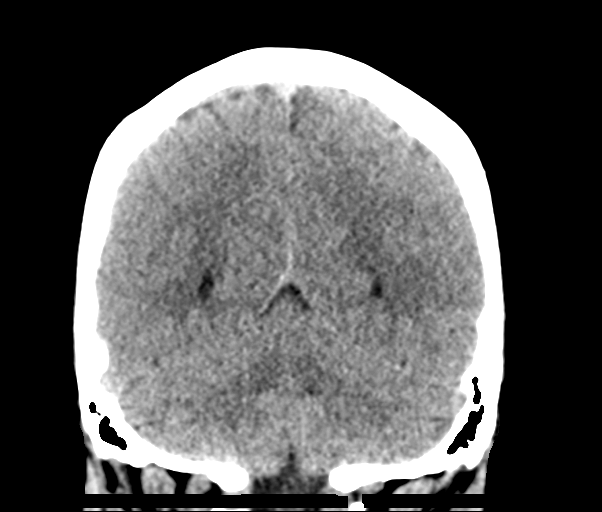
[im 30/67  brain]
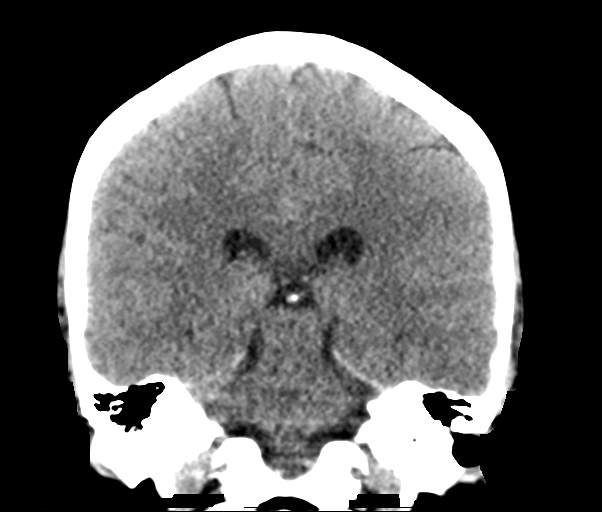
[im 37/67  brain]
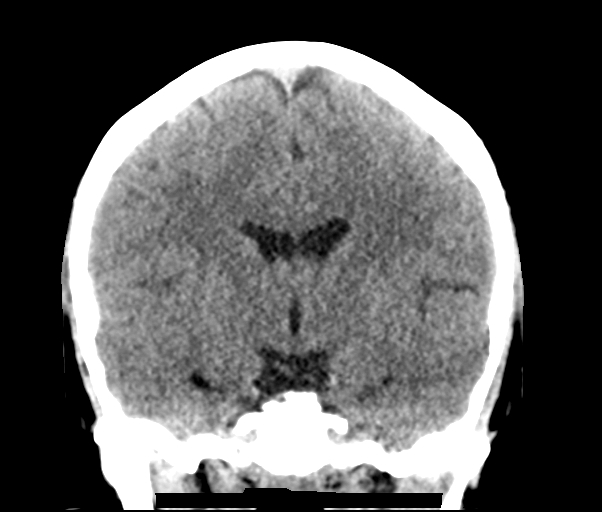

[Series 5: head 3.0 mpr sag · sagittal · 0.30mm/px · 3 of 60 slices shown]
[im 20/60  brain]
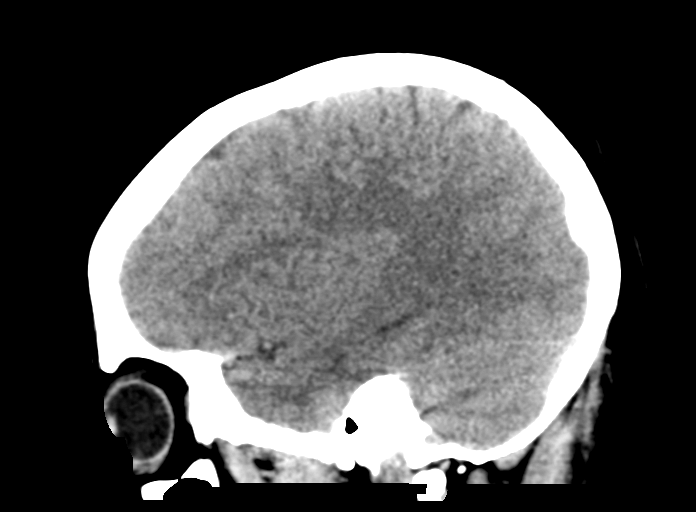
[im 30/60  brain]
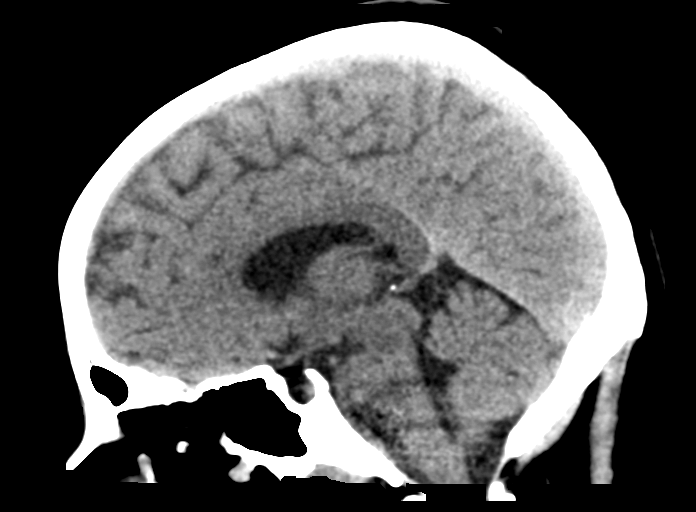
[im 40/60  brain]
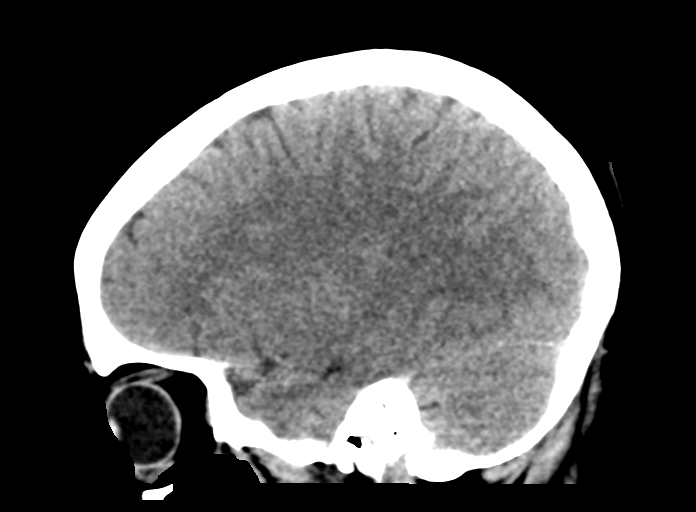

[15 of 47 positions shown; findings below may reference images not displayed]

FINDINGS: Brain:

No evidence of acute intracranial hemorrhage.

No demarcated cortical infarction.

No evidence of intracranial mass.

No midline shift or extra-axial fluid collection.

Vascular: No hyperdense vessel.

Skull: Normal. Negative for fracture or focal lesion.

Sinuses/Orbits: Visualized orbits demonstrate no acute abnormality.
No significant paranasal sinus disease or mastoid effusion.
IMPRESSION: No evidence of acute intracranial abnormality.
